# Patient Record
Sex: Male | Born: 1975 | Hispanic: Yes | Marital: Single | Smoking: Never smoker
Health system: Southern US, Community
[De-identification: ages and names within clinical notes are randomized; demographics above are authoritative.]

---

## 2016-02-17 ENCOUNTER — Emergency Department (HOSPITAL_COMMUNITY)
Admission: EM | Admit: 2016-02-17 | Discharge: 2016-02-17 | Disposition: A | Discharge status: Home / Self Care | Attending: Emergency Medicine | Admitting: Emergency Medicine

## 2016-02-17 ENCOUNTER — Encounter (HOSPITAL_COMMUNITY): Payer: Self-pay | Admitting: Emergency Medicine

## 2016-02-17 ENCOUNTER — Emergency Department (HOSPITAL_COMMUNITY)

## 2016-02-17 DIAGNOSIS — W19XXXA Unspecified fall, initial encounter: Secondary | ICD-10-CM

## 2016-02-17 DIAGNOSIS — M79601 Pain in right arm: Secondary | ICD-10-CM | POA: Insufficient documentation

## 2016-02-17 DIAGNOSIS — Y939 Activity, unspecified: Secondary | ICD-10-CM | POA: Diagnosis not present

## 2016-02-17 DIAGNOSIS — S0181XA Laceration without foreign body of other part of head, initial encounter: Secondary | ICD-10-CM

## 2016-02-17 DIAGNOSIS — S01411A Laceration without foreign body of right cheek and temporomandibular area, initial encounter: Secondary | ICD-10-CM | POA: Insufficient documentation

## 2016-02-17 DIAGNOSIS — R1011 Right upper quadrant pain: Secondary | ICD-10-CM | POA: Diagnosis not present

## 2016-02-17 DIAGNOSIS — M25561 Pain in right knee: Secondary | ICD-10-CM | POA: Insufficient documentation

## 2016-02-17 DIAGNOSIS — W06XXXA Fall from bed, initial encounter: Secondary | ICD-10-CM | POA: Diagnosis not present

## 2016-02-17 DIAGNOSIS — R51 Headache: Secondary | ICD-10-CM | POA: Diagnosis not present

## 2016-02-17 DIAGNOSIS — Y9289 Other specified places as the place of occurrence of the external cause: Secondary | ICD-10-CM | POA: Insufficient documentation

## 2016-02-17 DIAGNOSIS — Y999 Unspecified external cause status: Secondary | ICD-10-CM | POA: Diagnosis not present

## 2016-02-17 LAB — I-STAT CHEM 8, ED
BUN: 14 mg/dL (ref 6–20)
Calcium, Ion: 1.2 mmol/L (ref 1.13–1.30)
Chloride: 102 mmol/L (ref 101–111)
Creatinine, Ser: 0.8 mg/dL (ref 0.61–1.24)
GLUCOSE: 113 mg/dL — AB (ref 65–99)
HCT: 44 % (ref 39.0–52.0)
HEMOGLOBIN: 15 g/dL (ref 13.0–17.0)
Potassium: 3.8 mmol/L (ref 3.5–5.1)
Sodium: 141 mmol/L (ref 135–145)
TCO2: 25 mmol/L (ref 0–100)

## 2016-02-17 MED ORDER — LIDOCAINE-EPINEPHRINE (PF) 1 %-1:200000 IJ SOLN
INTRAMUSCULAR | Status: AC
  Administered 2016-02-17: 20 mL via INTRADERMAL
  Filled 2016-02-17: qty 30

## 2016-02-17 MED ORDER — IOPAMIDOL (ISOVUE-300) INJECTION 61%
100.0000 mL | Freq: Once | INTRAVENOUS | Status: AC | PRN
  Administered 2016-02-17: 100 mL via INTRAVENOUS

## 2016-02-17 MED ORDER — TETANUS-DIPHTH-ACELL PERTUSSIS 5-2.5-18.5 LF-MCG/0.5 IM SUSP
0.5000 mL | Freq: Once | INTRAMUSCULAR | Status: AC
  Administered 2016-02-17: 0.5 mL via INTRAMUSCULAR
  Filled 2016-02-17: qty 0.5

## 2016-02-17 MED ORDER — LIDOCAINE-EPINEPHRINE (PF) 2 %-1:200000 IJ SOLN
20.0000 mL | Freq: Once | INTRAMUSCULAR | Status: AC
  Administered 2016-02-17: 20 mL via INTRADERMAL

## 2016-02-17 NOTE — ED Provider Notes (Signed)
CSN: [id number]     Arrival date & time 02/17/16  4098 History  By signing my name below, I, [name], attest that this documentation has been prepared under the direction and in the presence of [name], MD.   Electronically Signed: [name], ED Scribe. 02/17/2016. 9:33 AM   Chief Complaint  Patient presents with  . Fall   The history is provided by the patient. No language interpreter was used.   HPI Comments: Aaron Riddle is a 40 y.o. male who presents to the Emergency Department complaining of sudden onset, right cheek laceration s/p fall from the top bunk in jail this morning around 6:30 AM. Sheriff reports that the bunk was about 5 feet off the ground. Pt states LOC in the incident. Pt reports associated right-sided abdominal pain, right knee pain, and right arm pain. No other associated symptoms noted. No worsening or alleviating factors noted. Pt denies neck pain, back pain, chest pain, visual disturbance, weakness, numbness, or any other pertinent symptoms. Pt's tetanus is not UTD. No blood thinner use.  No past medical history on file. No past surgical history on file. No family history on file. Social History  Substance Use Topics  . Smoking status: Not on file  . Smokeless tobacco: Not on file  . Alcohol Use: Not on file    Review of Systems A complete 10 system review of systems was obtained and all systems are negative except as noted in the HPI and PMH.    Allergies  Review of patient's allergies indicates not on file.  Home Medications   Prior to Admission medications   Not on File   BP 108/74 mmHg  Pulse 67  Temp(Src) 98.4 F (36.9 C) (Oral)  Resp 18  SpO2 100% Physical Exam  Constitutional: He is oriented to person, place, and time. He appears well-developed and well-nourished. No distress.  HENT:  Head: Normocephalic and atraumatic.  Mouth/Throat: Oropharynx is clear and moist. No oropharyngeal exudate.  Eyes: Conjunctivae and  EOM are normal. Pupils are equal, round, and reactive to light.  Bilateral pterygium  Neck: Normal range of motion. Neck supple.  No meningismus.  Cardiovascular: Normal rate, regular rhythm, normal heart sounds and intact distal pulses.   No murmur heard. Pulmonary/Chest: Effort normal and breath sounds normal. No respiratory distress. He exhibits no tenderness.  Abdominal: Soft. There is tenderness. There is no rebound and no guarding.  Right upper abdominal TTP. No guarding; no rebound.  Musculoskeletal: Normal range of motion. He exhibits no edema.       Right knee: Tenderness found.       Right hand: He exhibits tenderness.  Tenderness to Right 1st metacarpal. No swelling; no ecchymosis. Intact radial pulse. 4 cm laceration to right cheek. Smile symmetric. No cervical spine TTP. TTP to right anterior knee. Flexion and extension intact. No ligamentous laxity. No rib tenderness.  Neurological: He is alert and oriented to person, place, and time. No cranial nerve deficit. He exhibits normal muscle tone. Coordination normal.  No ataxia on finger to nose bilaterally. No pronator drift. 5/5 strength throughout. CN 2-12 intact.Equal grip strength. Sensation intact.   Skin: Skin is warm.  Psychiatric: He has a normal mood and affect. His behavior is normal.  Nursing note and vitals reviewed.   ED Course  Procedures (including critical care time) DIAGNOSTIC STUDIES: Oxygen Saturation is 100% on RA, normal by my interpretation.    COORDINATION OF CARE: 9:46 AM Discussed treatment plan which includes laceration repair,  CT abdomen pelvis with contrast, CT cervical spine without contrast, CT head without contrast, DG hand complete right, DG knee 4 views right, CT maxillofacial without CM, and CXR with pt at bedside and pt agreed to plan.  Labs Review Labs Reviewed  I-STAT CHEM 8, ED - Abnormal; Notable for the following:    Glucose, Bld 113 (*)    All other components within normal limits     Imaging Review Dg Chest 2 View  02/17/2016  CLINICAL DATA:  Fall today with chest pain, initial encounter EXAM: CHEST  2 VIEW COMPARISON:  None. FINDINGS: Cardiac shadow is within normal limits. The lungs are hypoinflated with minimal bibasilar atelectasis. No focal confluent infiltrate is seen. No sizable effusion is noted. No acute bony abnormality is seen. IMPRESSION: Hypoinflation with bibasilar atelectasis. Electronically Signed   By: Alcide CleverMark  Lukens M.D.   On: 02/17/2016 11:02   Ct Head Wo Contrast  02/17/2016  CLINICAL DATA:  Pain following fall with transient loss of consciousness. Headache. EXAM: CT HEAD WITHOUT CONTRAST CT MAXILLOFACIAL WITHOUT CONTRAST CT CERVICAL SPINE WITHOUT CONTRAST TECHNIQUE: Multidetector CT imaging of the head, cervical spine, and maxillofacial structures were performed using the standard protocol without intravenous contrast. Multiplanar CT image reconstructions of the cervical spine and maxillofacial structures were also generated. COMPARISON:  None. FINDINGS: CT HEAD FINDINGS The ventricles are normal in size and configuration. There is no intracranial mass, hemorrhage, extra-axial fluid collection, or midline shift. The gray-white compartments appear normal. Bony calvarium appears intact. Mastoid air cells are clear. CT MAXILLOFACIAL FINDINGS There is no demonstrable fracture or dislocation. Orbits appear symmetric and normal bilaterally. There is mild soft tissue swelling over the preseptal right orbit. There is mucosal thickening in the right maxillary antrum with a retention cyst in the inferior right maxillary antrum measuring 1 x 1 cm. There is slight mucosal thickening in several ethmoid air cells. There is no air-fluid level or bony destruction. Other paranasal sinuses are clear. Ostiomeatal unit complexes are patent bilaterally. There is edema of the nasal terminates without nares obstruction. There is slight leftward deviation of the nasal septum. Salivary  glands appear normal. No adenopathy. Visualized pharynx appears normal. CT CERVICAL SPINE FINDINGS There is no fracture or spondylolisthesis. Prevertebral soft tissues and predental space regions are normal. The disc spaces appear normal. There is no appreciable nerve root edema or effacement. No disc extrusion or stenosis. IMPRESSION: CT head:  Study within normal limits. CT maxillofacial: Mild soft tissue swelling over the right orbit, preseptal. No intraorbital lesions. No fracture or dislocation. Areas of paranasal sinus disease, primarily in the right maxillary antrum. Mild leftward deviation of the nasal septum. No air-fluid levels in the paranasal sinuses. CT cervical spine: No fracture or spondylolisthesis. No nerve root edema or effacement. No disc extrusion or stenosis. No appreciable arthropathic change noted. Electronically Signed   By: Bretta BangWilliam  Woodruff III M.D.   On: 02/17/2016 11:52   Ct Cervical Spine Wo Contrast  02/17/2016  CLINICAL DATA:  Pain following fall with transient loss of consciousness. Headache. EXAM: CT HEAD WITHOUT CONTRAST CT MAXILLOFACIAL WITHOUT CONTRAST CT CERVICAL SPINE WITHOUT CONTRAST TECHNIQUE: Multidetector CT imaging of the head, cervical spine, and maxillofacial structures were performed using the standard protocol without intravenous contrast. Multiplanar CT image reconstructions of the cervical spine and maxillofacial structures were also generated. COMPARISON:  None. FINDINGS: CT HEAD FINDINGS The ventricles are normal in size and configuration. There is no intracranial mass, hemorrhage, extra-axial fluid collection, or midline shift. The gray-white  compartments appear normal. Bony calvarium appears intact. Mastoid air cells are clear. CT MAXILLOFACIAL FINDINGS There is no demonstrable fracture or dislocation. Orbits appear symmetric and normal bilaterally. There is mild soft tissue swelling over the preseptal right orbit. There is mucosal thickening in the right  maxillary antrum with a retention cyst in the inferior right maxillary antrum measuring 1 x 1 cm. There is slight mucosal thickening in several ethmoid air cells. There is no air-fluid level or bony destruction. Other paranasal sinuses are clear. Ostiomeatal unit complexes are patent bilaterally. There is edema of the nasal terminates without nares obstruction. There is slight leftward deviation of the nasal septum. Salivary glands appear normal. No adenopathy. Visualized pharynx appears normal. CT CERVICAL SPINE FINDINGS There is no fracture or spondylolisthesis. Prevertebral soft tissues and predental space regions are normal. The disc spaces appear normal. There is no appreciable nerve root edema or effacement. No disc extrusion or stenosis. IMPRESSION: CT head:  Study within normal limits. CT maxillofacial: Mild soft tissue swelling over the right orbit, preseptal. No intraorbital lesions. No fracture or dislocation. Areas of paranasal sinus disease, primarily in the right maxillary antrum. Mild leftward deviation of the nasal septum. No air-fluid levels in the paranasal sinuses. CT cervical spine: No fracture or spondylolisthesis. No nerve root edema or effacement. No disc extrusion or stenosis. No appreciable arthropathic change noted. Electronically Signed   By: Bretta Bang III M.D.   On: 02/17/2016 11:52   Ct Abdomen Pelvis W Contrast  02/17/2016  CLINICAL DATA:  Headache today.  Right side abdominal pain EXAM: CT ABDOMEN AND PELVIS WITH CONTRAST TECHNIQUE: Multidetector CT imaging of the abdomen and pelvis was performed using the standard protocol following bolus administration of intravenous contrast. CONTRAST:  ISOVUE-300 IOPAMIDOL (ISOVUE-300) INJECTION 61% COMPARISON:  None. FINDINGS: Lower chest: Heart is normal in size. Minimal dependent atelectasis in the lung bases. No effusions. Hepatobiliary: Mild diffuse fatty infiltration of the liver. Gallbladder unremarkable. Pancreas: No focal  abnormality or ductal dilatation. Spleen: No focal abnormality.  Normal size. Adrenals/Urinary Tract: No adrenal abnormality. No focal renal abnormality. No stones or hydronephrosis. Urinary bladder is unremarkable. Stomach/Bowel: Normal appendix. Stomach, large and small bowel grossly unremarkable. Vascular/Lymphatic: No evidence of aneurysm or adenopathy. Reproductive: No visible focal abnormality. Other: No free fluid or free air. Musculoskeletal: No acute bony abnormality or focal bone lesion. IMPRESSION: Mild fatty infiltration of the liver. No acute findings in the abdomen or pelvis. Electronically Signed   By: Charlett Nose M.D.   On: 02/17/2016 11:50   Dg Knee Complete 4 Views Right  02/17/2016  CLINICAL DATA:  Fall today with right knee pain, initial encounter EXAM: RIGHT KNEE - COMPLETE 4+ VIEW COMPARISON:  None. FINDINGS: No evidence of fracture, dislocation, or joint effusion. No evidence of arthropathy or other focal bone abnormality. Soft tissues are unremarkable. IMPRESSION: No acute abnormality noted. Electronically Signed   By: Alcide Clever M.D.   On: 02/17/2016 11:01   Dg Hand Complete Right  02/17/2016  CLINICAL DATA:  Fall this morning with right hand pain, initial encounter EXAM: RIGHT HAND - COMPLETE 3+ VIEW COMPARISON:  None. FINDINGS: There is no evidence of fracture or dislocation. There is no evidence of arthropathy or other focal bone abnormality. Soft tissues are unremarkable. IMPRESSION: No acute abnormality noted. Electronically Signed   By: Alcide Clever M.D.   On: 02/17/2016 11:01   Ct Maxillofacial Wo Cm  02/17/2016  CLINICAL DATA:  Pain following fall with transient loss of  consciousness. Headache. EXAM: CT HEAD WITHOUT CONTRAST CT MAXILLOFACIAL WITHOUT CONTRAST CT CERVICAL SPINE WITHOUT CONTRAST TECHNIQUE: Multidetector CT imaging of the head, cervical spine, and maxillofacial structures were performed using the standard protocol without intravenous contrast. Multiplanar CT  image reconstructions of the cervical spine and maxillofacial structures were also generated. COMPARISON:  None. FINDINGS: CT HEAD FINDINGS The ventricles are normal in size and configuration. There is no intracranial mass, hemorrhage, extra-axial fluid collection, or midline shift. The gray-white compartments appear normal. Bony calvarium appears intact. Mastoid air cells are clear. CT MAXILLOFACIAL FINDINGS There is no demonstrable fracture or dislocation. Orbits appear symmetric and normal bilaterally. There is mild soft tissue swelling over the preseptal right orbit. There is mucosal thickening in the right maxillary antrum with a retention cyst in the inferior right maxillary antrum measuring 1 x 1 cm. There is slight mucosal thickening in several ethmoid air cells. There is no air-fluid level or bony destruction. Other paranasal sinuses are clear. Ostiomeatal unit complexes are patent bilaterally. There is edema of the nasal terminates without nares obstruction. There is slight leftward deviation of the nasal septum. Salivary glands appear normal. No adenopathy. Visualized pharynx appears normal. CT CERVICAL SPINE FINDINGS There is no fracture or spondylolisthesis. Prevertebral soft tissues and predental space regions are normal. The disc spaces appear normal. There is no appreciable nerve root edema or effacement. No disc extrusion or stenosis. IMPRESSION: CT head:  Study within normal limits. CT maxillofacial: Mild soft tissue swelling over the right orbit, preseptal. No intraorbital lesions. No fracture or dislocation. Areas of paranasal sinus disease, primarily in the right maxillary antrum. Mild leftward deviation of the nasal septum. No air-fluid levels in the paranasal sinuses. CT cervical spine: No fracture or spondylolisthesis. No nerve root edema or effacement. No disc extrusion or stenosis. No appreciable arthropathic change noted. Electronically Signed   By: Bretta BangWilliam  Woodruff III M.D.   On:  02/17/2016 11:52   I have personally reviewed and evaluated these images and lab results as part of my medical decision-making.   EKG Interpretation None      MDM   Final diagnoses:  Fall, initial encounter  Facial laceration, initial encounter   Patient fell from upper bunk in jail. Struck head on metal bracket with loss of consciousness. Facial laceration. Also complains of right hand pain, right knee pain, right upper abdominal pain.  CT head and face obtained. Tetanus updated.  Traumatic imaging negative for acute pathology. Tetanus updated.  Laceration repair by Tiburcio PeaHarris PAC.  He is tolerating PO and ambulatory. He is ambulatory.  Follow up in 5-7 days for suture removal. Return precautions discussed.   I personally performed the services described in this documentation, which was scribed in my presence. The recorded information has been reviewed and is accurate.    Glynn OctaveStephen Ido Wollman, MD 02/17/16 [phone]

## 2016-02-17 NOTE — ED Notes (Signed)
EDP at bedside  

## 2016-02-17 NOTE — Discharge Instructions (Signed)
Laceracin facial Follow up for suture removal in 1 week. Return to the ED if you develop new or worsening symptoms. (Facial Laceration) Una laceracin facial es un corte en el rostro. Estas lesiones pueden ser dolorosas y [place]an sangrado. Es posible que algunos cortes deban cerrarse con puntos (suturas), tiras [place]adhesivas para la piel o [place]adhesivo para heridas. Normalmente los cortes se curan rpidamente, pero pueden dejar una cicatriz. Puede demorar entre uno y dosaos para que la cicatriz desaparezca completamente. CUIDADOS EN EL HOGAR   Solo tome los medicamentos que le haya indicado su mdico.  Siga las instrucciones de su mdico para el cuidado de la herida. En caso de que tenga puntos:  Mantenga la herida limpia y [place]seca.  Si tiene una venda (vendaje) cmbiela al menos una vez al da. Cambie el vendaje si se moja o se ensucia, o segn las indicaciones del mdico.  Lave el corte dos veces por da con agua y [place]jabn. Enjuguelo con agua. Seque dando palmaditas con un pao limpio y seco.  Aplique una capa delgada de crema con medicamento sobre el corte, segn las indicaciones del mdico.  Puede ducharse despus de las primeras 24 horas. No moje la herida hasta que le hayan quitado los puntos.  Concurra al mdico cuando este lo indique para que le retiren los puntos.  No use maquillaje [place] unos das despus de que le quiten los puntos. En caso que tenga tiras ZOXWRUEAVadhesivas para la piel:  Mantenga la herida limpia y seca.  No permita que las tiras se mojen. Puede baarse, pero tenga cuidado de no mojar el corte.  Si se moja, squelo dando palmaditas con una toalla limpia.  Las tiras caern por s mismas. No quite las tiras que an estn adheridas al corte. En caso de que le hayan aplicado [place]adhesivo para heridas:  Puede ducharse o tomar baos de inmersin. No frote ni sumerja el corte. No practique natacin. Evite transpirar mucho hasta que el [place] Pointadhesivo desaparezca. Despus de ducharse o  darse un bao, seque el corte dando palmaditas con una toalla limpia.  No coloque medicamentos ni maquillaje en el corte hasta que el adhesivo se haya cado.  Si tiene un vendaje, no pegue cinta USAAadhesiva sobre el adhesivo.  Evite la IT consultantluz solar o las lmparas para bronceado hasta que el [place] Lakeadhesivo se haya cado.  El QUALCOMMadhesivo caer por s solo en 5 a 10das. No toque el [place]adhesivo. Despus de la curacin:  Aplique pantalla solar sobre el corte durante Dispensing opticianel primer ao, para reducir la Training and development officercicatriz. SOLICITE AYUDA SI:  Tiene fiebre. SOLICITE AYUDA DE INMEDIATO SI:   La zona del corte est roja, le duele o est hinchada.  Observa una secrecin de color blanco amarillento (pus) que sale del corte.   Esta informacin no tiene Theme park managercomo fin reemplazar el consejo del mdico. Asegrese de hacerle al mdico cualquier pregunta que tenga.   Document Released: 04/06/2011 Document Revised: 08/29/2014 Elsevier Interactive Patient Education [employer]2016 Elsevier Inc.

## 2016-02-17 NOTE — ED Notes (Addendum)
Pt is an inmate, brought it by sheriff's department for a fall. Interpreting service used to complete triage. Pt states that he woke up with a HA. Pt attempted to get off of the top bunk when he fell onto the concrete floor. Pt reports loss of consciousness after event. Pt states that he hit his abdomen on the metal railing of the bed. Pt c/o pain to RT side of face, abdomen, RT knee, RT arm/hand. Pt noted to have laceration on face and RT eye is red. Bleeding controlled. Pt ambulatory.

## 2016-02-17 NOTE — ED Provider Notes (Signed)
..  Laceration Repair Date/Time: 02/17/2016 1:48 PM Performed by: Arthor CaptainHARRIS, Shannin Naab Authorized by: Arthor CaptainHARRIS, Casara Perrier Consent: Verbal consent obtained. Risks and benefits: risks, benefits and alternatives were discussed Consent given by: patient Patient identity confirmed: verbally with patient and provided demographic data Time out: Immediately prior to procedure a "time out" was called to verify the correct patient, procedure, equipment, support staff and site/side marked as required. Body area: head/neck Location details: right cheek Laceration length: 3 cm Anesthesia: local infiltration Local anesthetic: lidocaine 2% with epinephrine Anesthetic total: 3 ml Irrigation solution: saline Irrigation method: syringe Amount of cleaning: standard Skin closure: 5-0 nylon Number of sutures: 4 Technique: running Approximation: close Approximation difficulty: simple Patient tolerance: Patient tolerated the procedure well with no immediate complications     Arthor Captainbigail Lenita Peregrina, PA-C 02/17/16 1716  Glynn OctaveStephen Rancour, MD 02/17/16 [id number]

## 2016-02-17 NOTE — ED Notes (Signed)
Pt back in room from CT 

## 2017-07-21 IMAGING — CT CT ABD-PELV W/ CM
2 of 4 series · 17 of 46 positions shown, 19 images · IV contrast (iopamidol)
Comparison: None.

CLINICAL DATA: Headache today.  Right side abdominal pain

EXAM:
CT ABDOMEN AND PELVIS WITH CONTRAST
TECHNIQUE: Multidetector CT imaging of the abdomen and pelvis was performed
using the standard protocol following bolus administration of
intravenous contrast.
CONTRAST:  100mL 6EAEJF-722 IOPAMIDOL (6EAEJF-722) INJECTION 61%

[Series 2: routine abd pel with · axial · 0.77mm/px · z∈[-474,-54]mm · 14 of 94 slices shown, 16 images]
[im 5/94  soft-tissue]
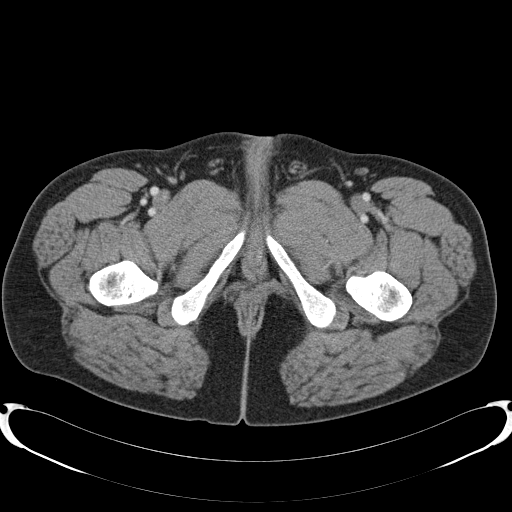
[im 5/94  bone]
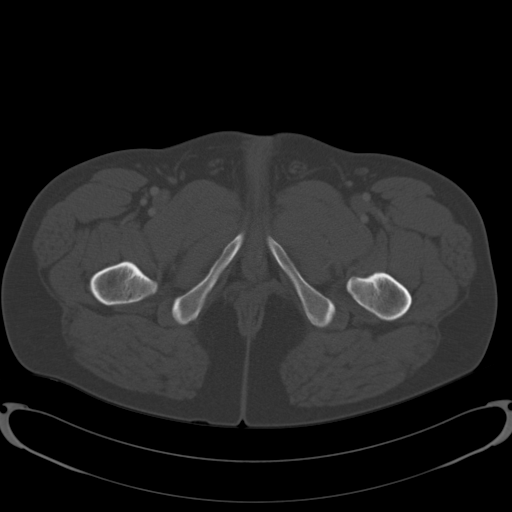
[im 14/94  soft-tissue]
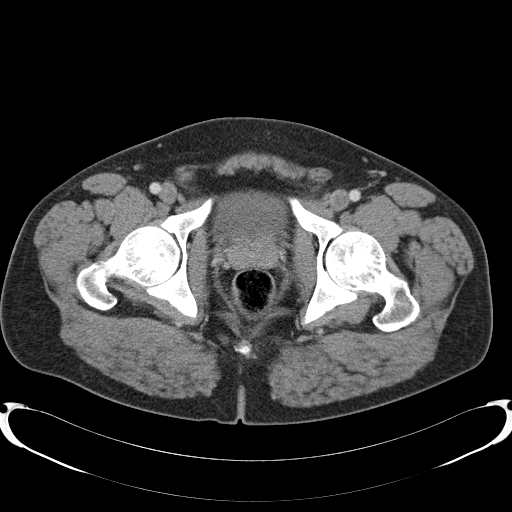
[im 18/94  soft-tissue]
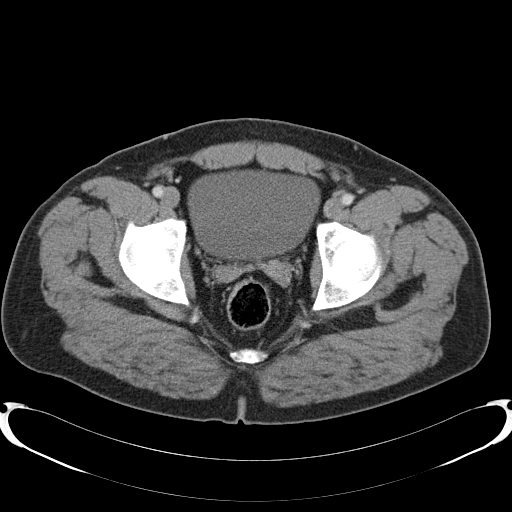
[im 27/94  soft-tissue]
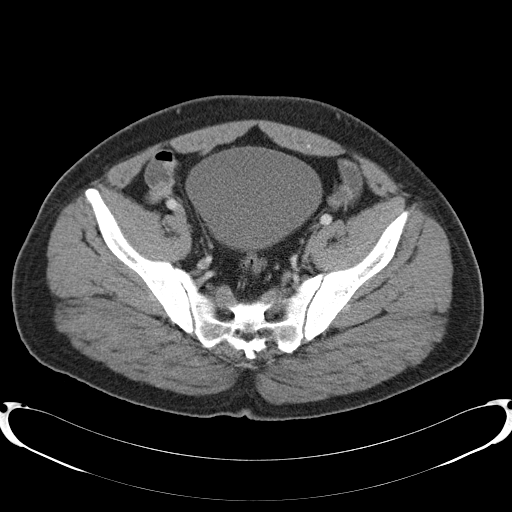
[im 32/94  soft-tissue]
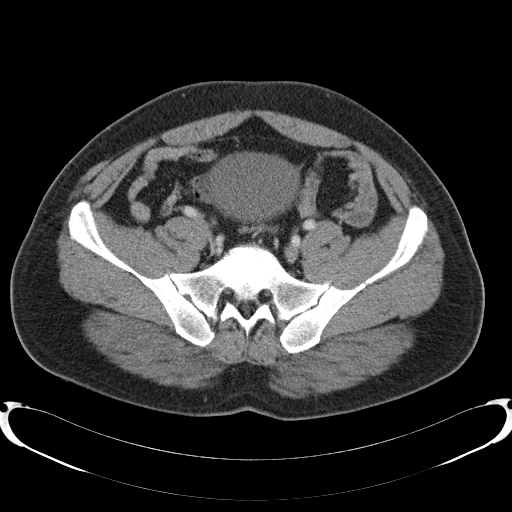
[im 36/94  soft-tissue]
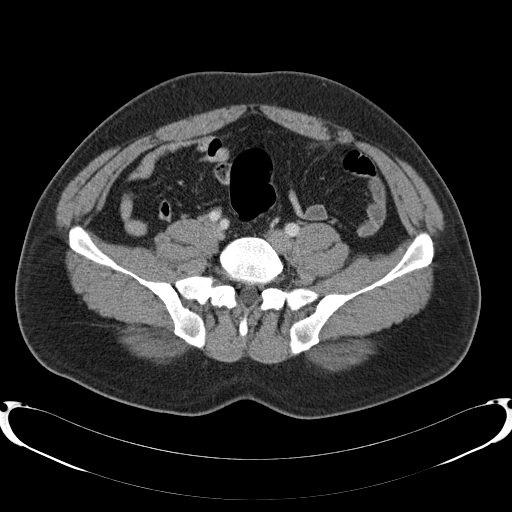
[im 45/94  soft-tissue]
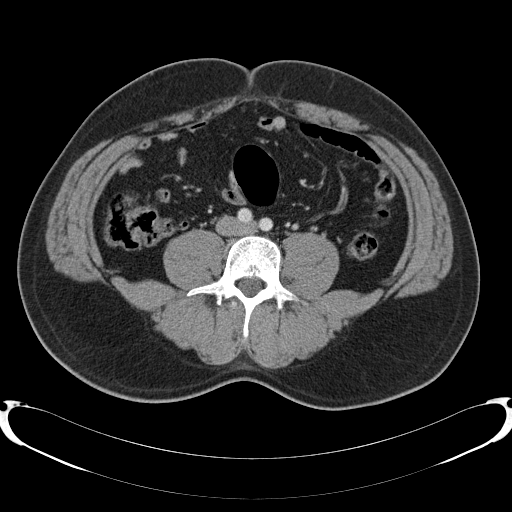
[im 49/94  soft-tissue]
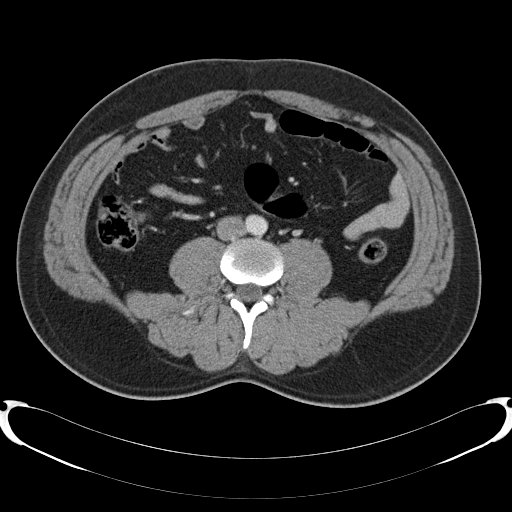
[im 58/94  soft-tissue]
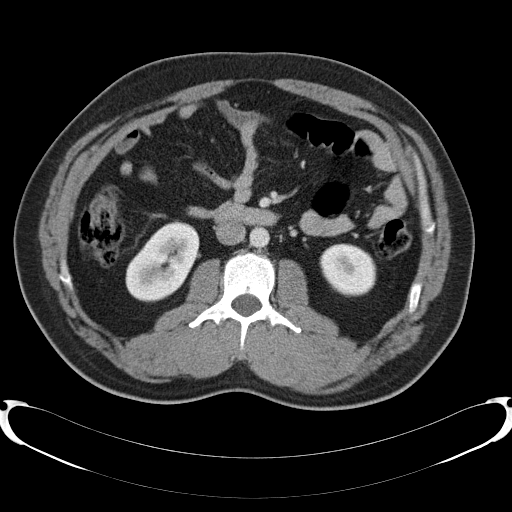
[im 58/94  bone]
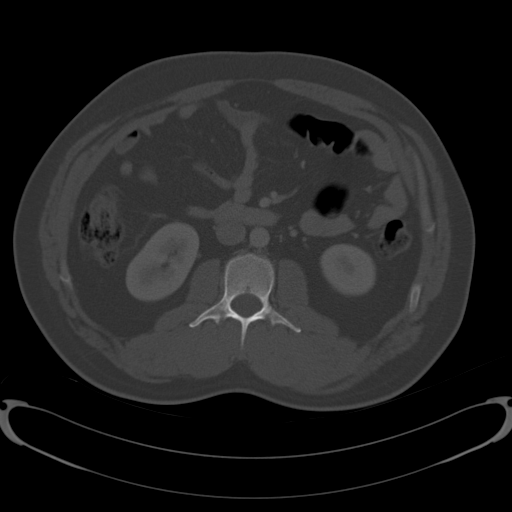
[im 63/94  soft-tissue]
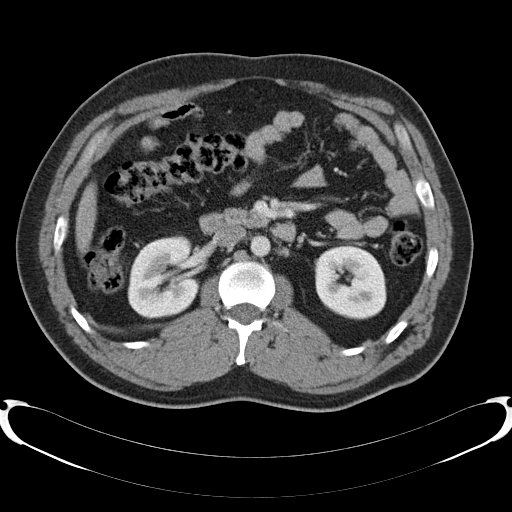
[im 71/94  soft-tissue]
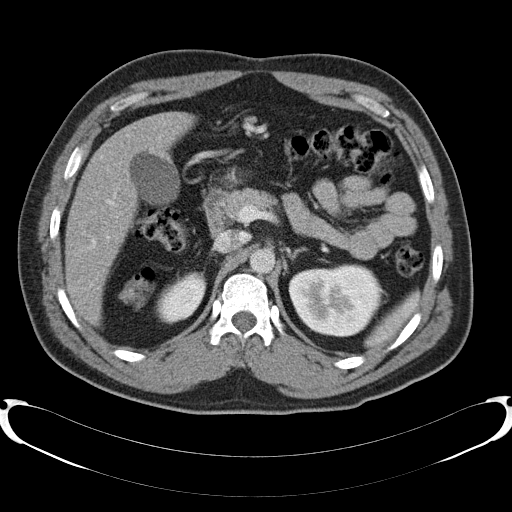
[im 76/94  soft-tissue]
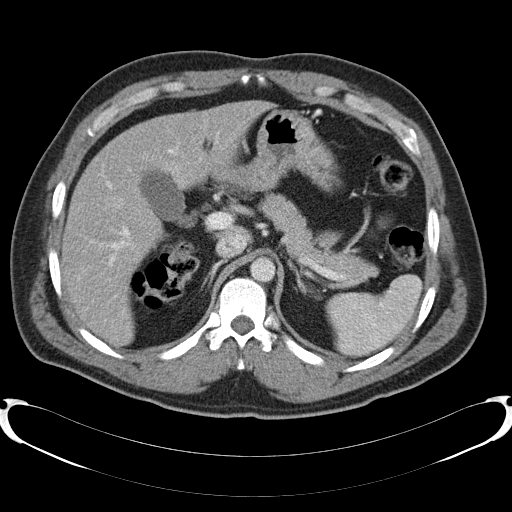
[im 80/94  soft-tissue]
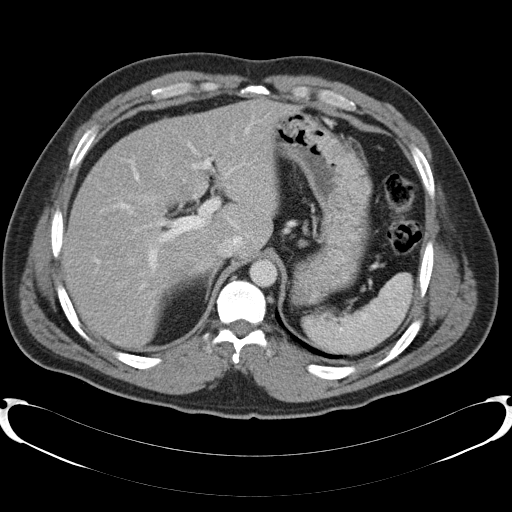
[im 89/94  soft-tissue]
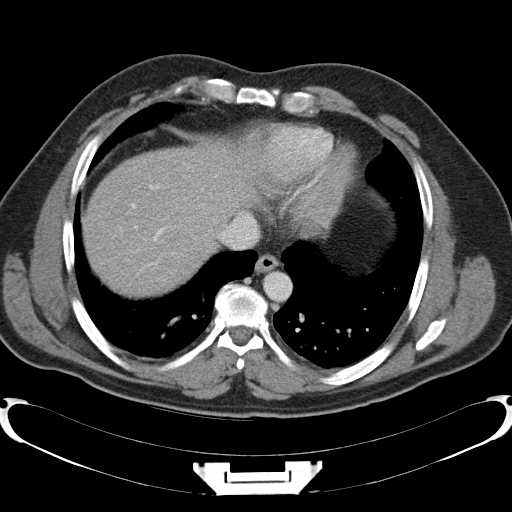

[Series 4: coronal · coronal · 0.82mm/px · 3 of 139 slices shown]
[im 47/139  soft-tissue]
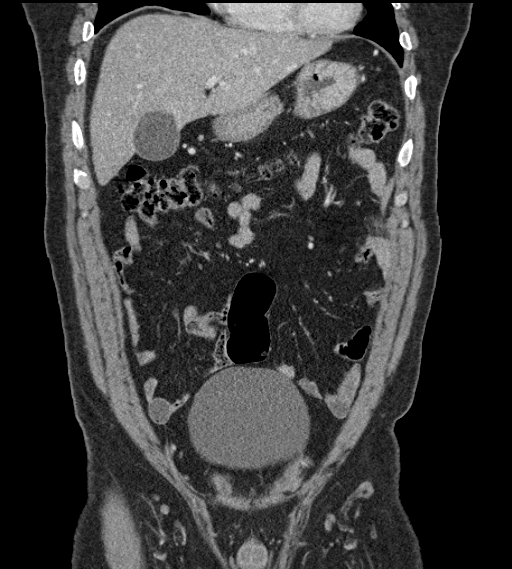
[im 62/139  soft-tissue]
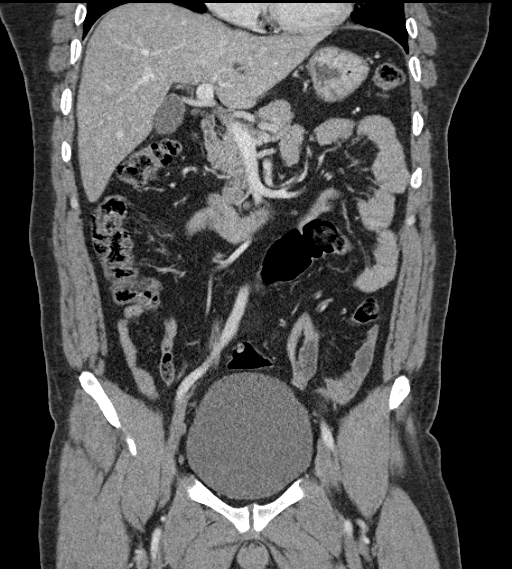
[im 77/139  soft-tissue]
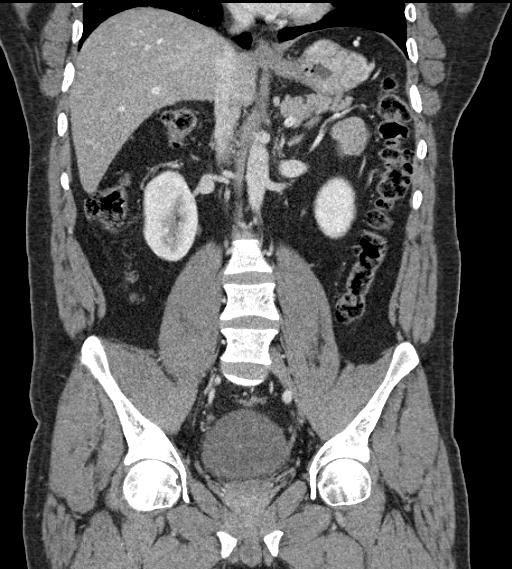

[17 of 46 positions shown; findings below may reference images not displayed]

FINDINGS: Lower chest: Heart is normal in size. Minimal dependent atelectasis
in the lung bases. No effusions.

Hepatobiliary: Mild diffuse fatty infiltration of the liver.
Gallbladder unremarkable.

Pancreas: No focal abnormality or ductal dilatation.

Spleen: No focal abnormality.  Normal size.

Adrenals/Urinary Tract: No adrenal abnormality. No focal renal
abnormality. No stones or hydronephrosis. Urinary bladder is
unremarkable.

Stomach/Bowel: Normal appendix. Stomach, large and small bowel
grossly unremarkable.

Vascular/Lymphatic: No evidence of aneurysm or adenopathy.

Reproductive: No visible focal abnormality.

Other: No free fluid or free air.

Musculoskeletal: No acute bony abnormality or focal bone lesion.
IMPRESSION: Mild fatty infiltration of the liver.

No acute findings in the abdomen or pelvis.

## 2017-07-21 IMAGING — DX DG CHEST 2V
2 series · 2 of 2 positions shown · non-contrast
Comparison: None.

CLINICAL DATA: Fall today with chest pain, initial encounter

EXAM:
CHEST  2 VIEW

[chest pa]
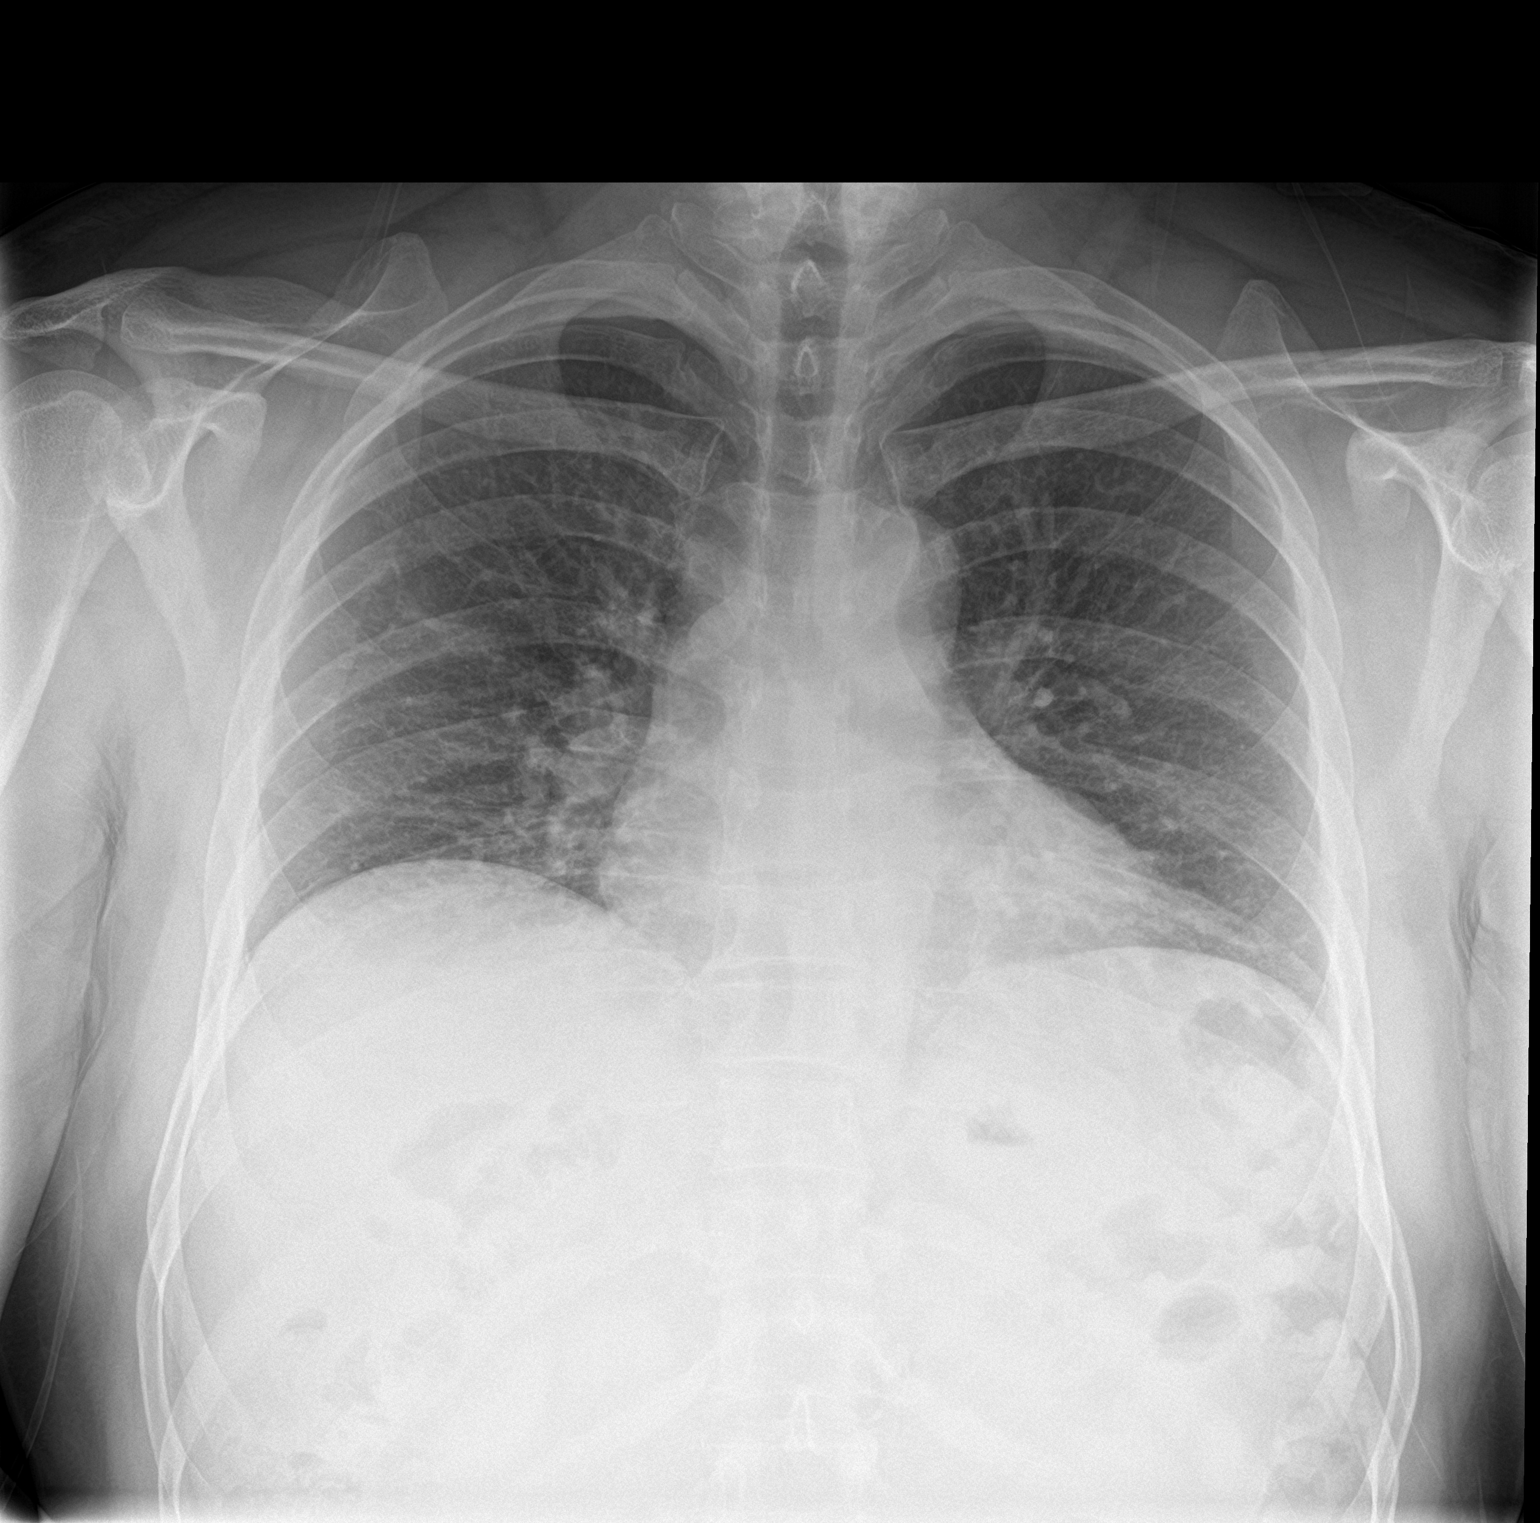

[chest lat]
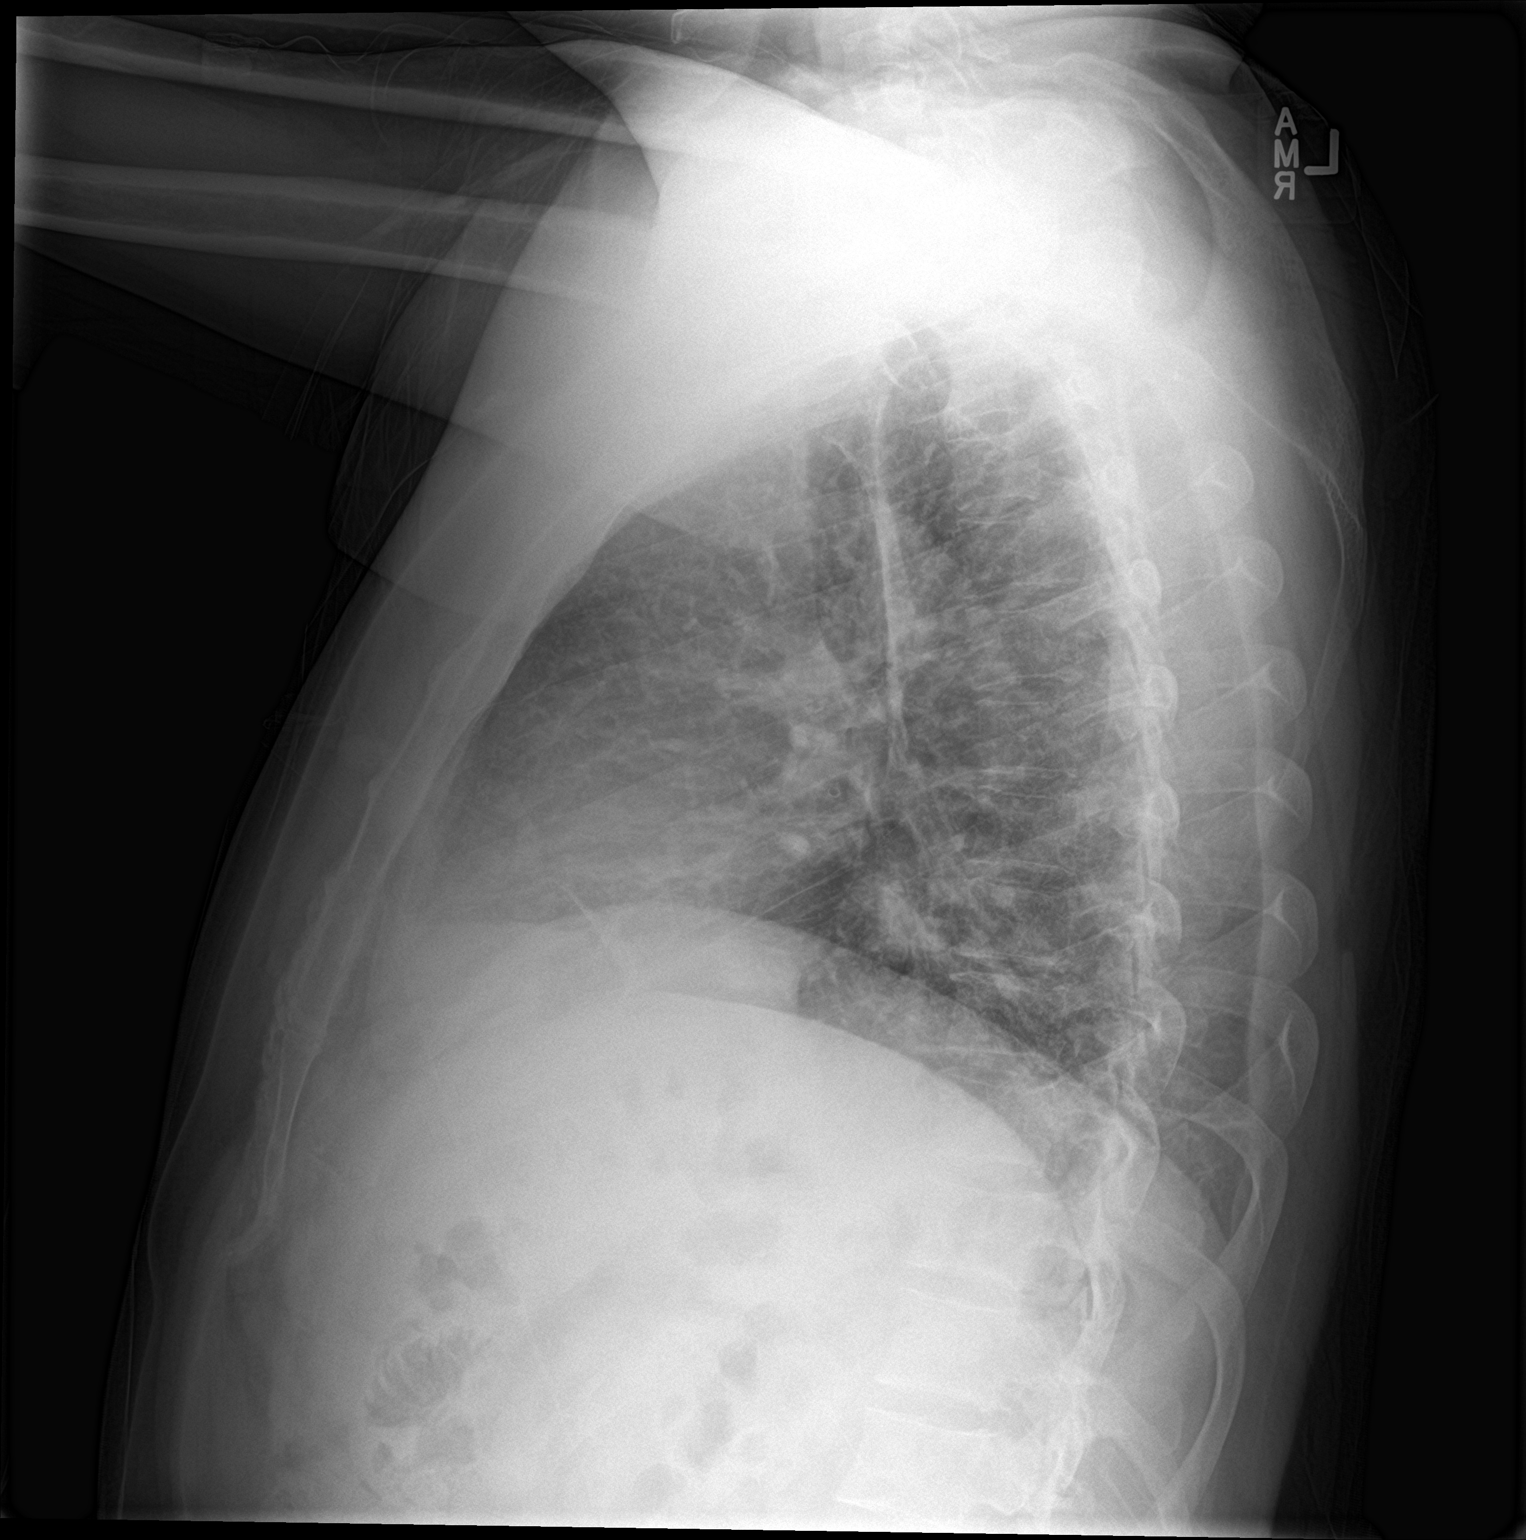

[2 of 2 positions shown; findings below may reference images not displayed]

FINDINGS: Cardiac shadow is within normal limits. The lungs are hypoinflated
with minimal bibasilar atelectasis. No focal confluent infiltrate is
seen. No sizable effusion is noted. No acute bony abnormality is
seen.
IMPRESSION: Hypoinflation with bibasilar atelectasis.
